# Patient Record
Sex: Male | Born: 1935 | Race: White | Hispanic: No
Health system: Southern US, Community
[De-identification: ages and names within clinical notes are randomized; demographics above are authoritative.]

---

## 2003-08-10 ENCOUNTER — Other Ambulatory Visit: Payer: Self-pay

## 2006-02-05 ENCOUNTER — Ambulatory Visit: Payer: Self-pay | Admitting: General Surgery

## 2006-05-06 ENCOUNTER — Emergency Department: Payer: Self-pay | Admitting: Emergency Medicine

## 2006-05-06 ENCOUNTER — Other Ambulatory Visit: Payer: Self-pay

## 2006-05-28 ENCOUNTER — Ambulatory Visit: Payer: Self-pay | Admitting: Family Medicine

## 2006-12-16 ENCOUNTER — Inpatient Hospital Stay: Payer: Self-pay | Admitting: Internal Medicine

## 2006-12-16 ENCOUNTER — Other Ambulatory Visit: Payer: Self-pay

## 2007-05-09 ENCOUNTER — Ambulatory Visit: Payer: Self-pay | Admitting: Family Medicine

## 2007-05-09 IMAGING — CR DG FEMUR 2V*L*
2 series · 5 of 5 positions shown · non-contrast
Comparison: none

REASON FOR EXAM: pain
COMMENTS:

PROCEDURE:     KDR - KDXR FEMUR LEFT  - [DATE]  [DATE]
RESULT:     No fracture, dislocation or other acute bony abnormality is
identified. No lytic or blastic lesions are seen. The hip joint space is
well maintained. Atherosclerotic calcification is noted in the femoral
artery.

[Series 2: view not recorded · 0.17mm/px · 4 of 4 slices shown (1 of 2)]
[im 1/4]
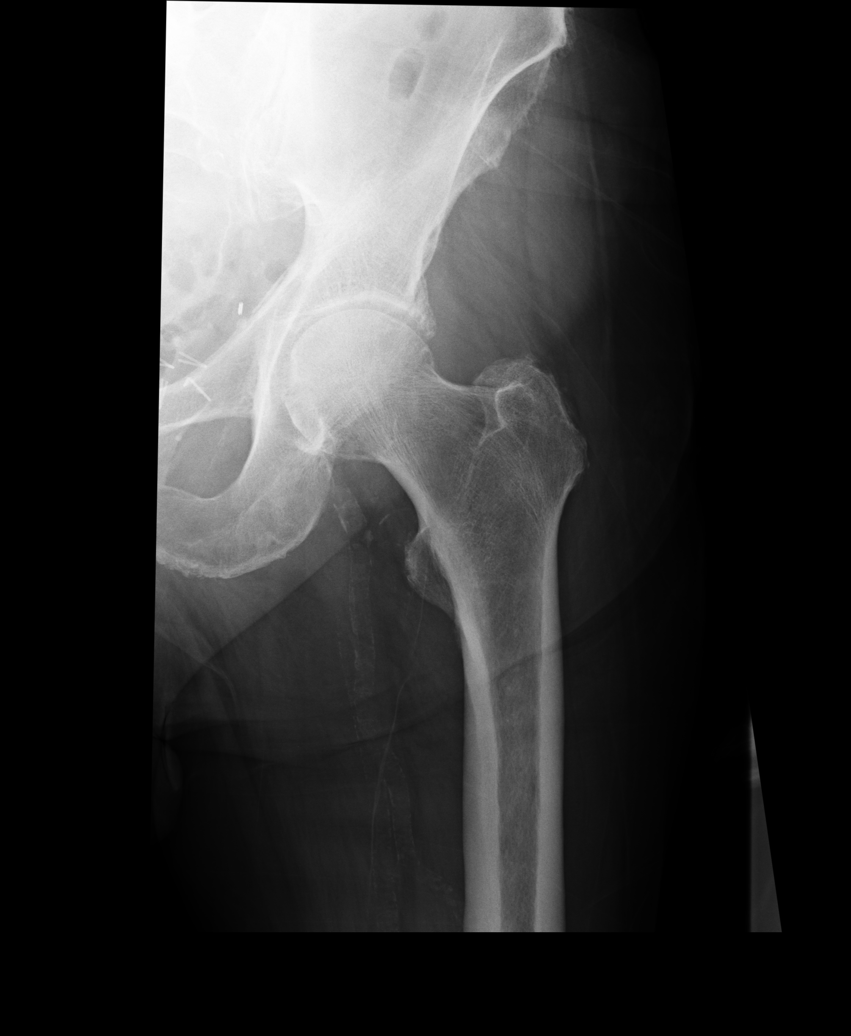
[im 2/4]
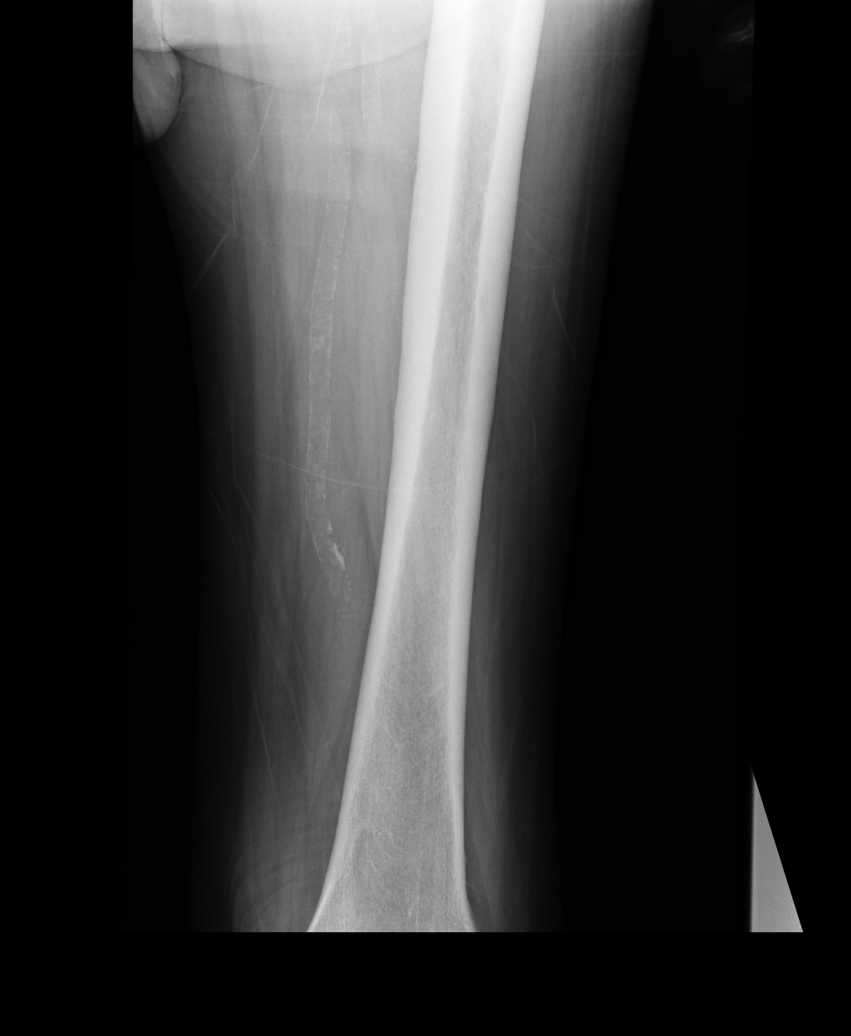
[im 3/4]
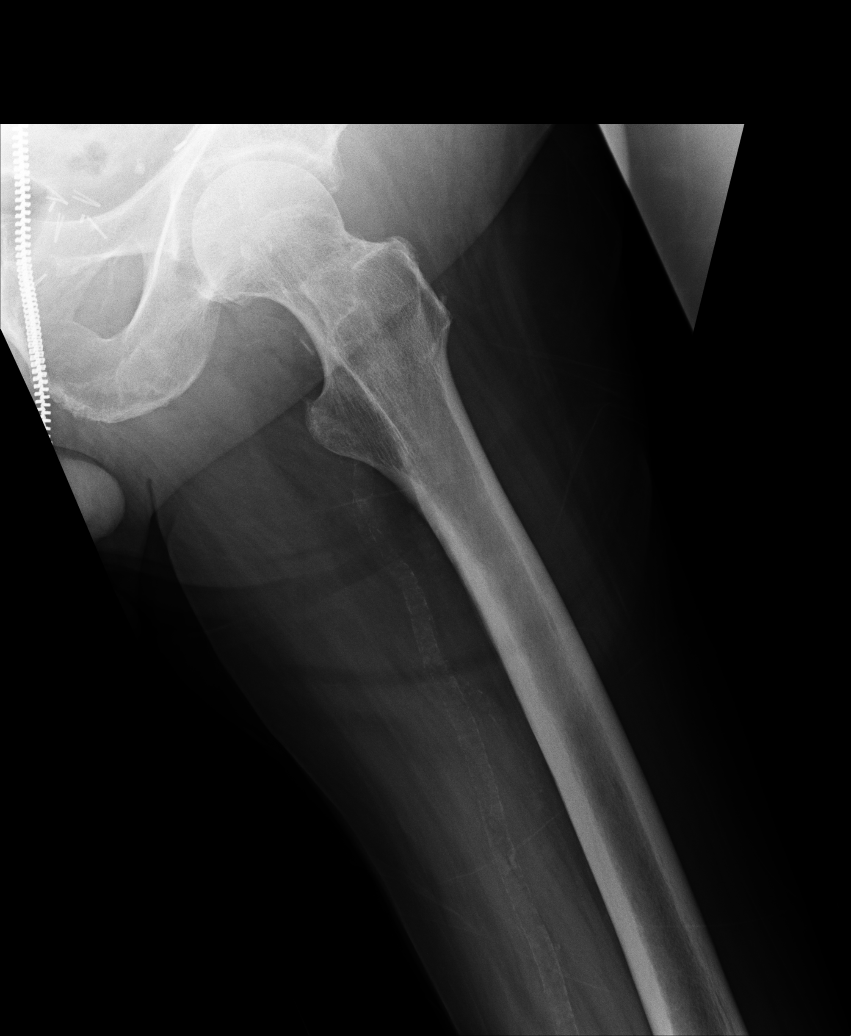
[im 4/4]
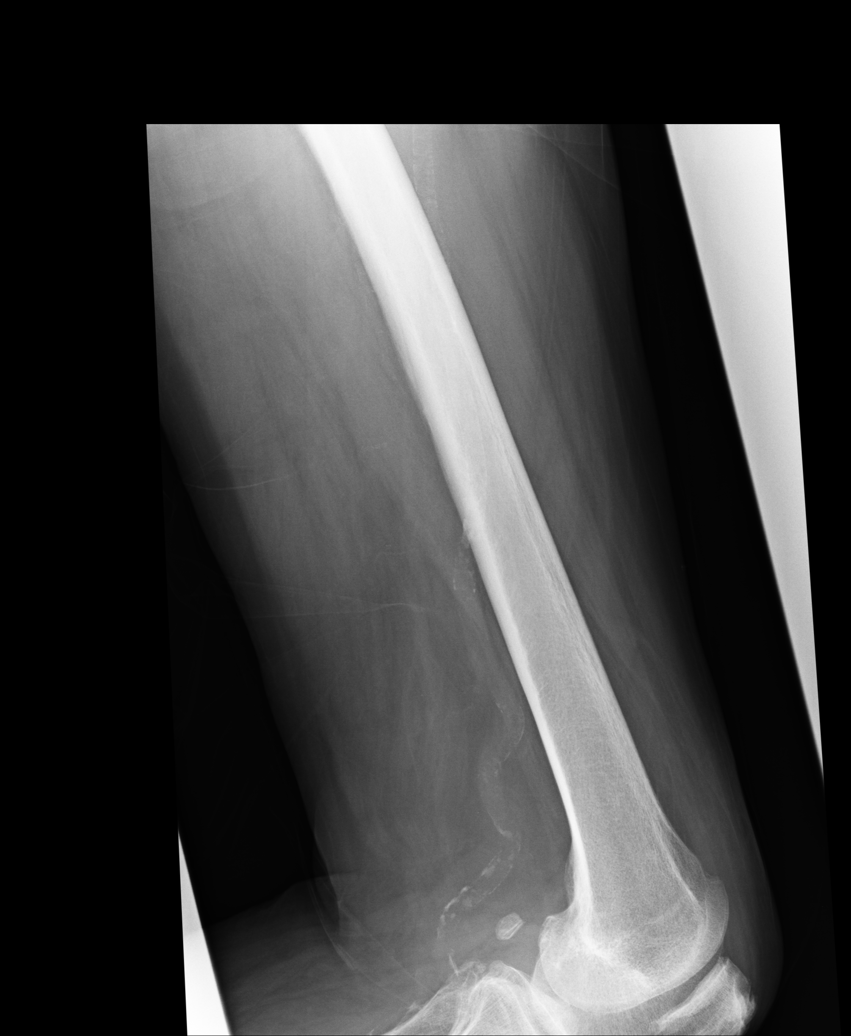

[view not recorded (2 of 2)]
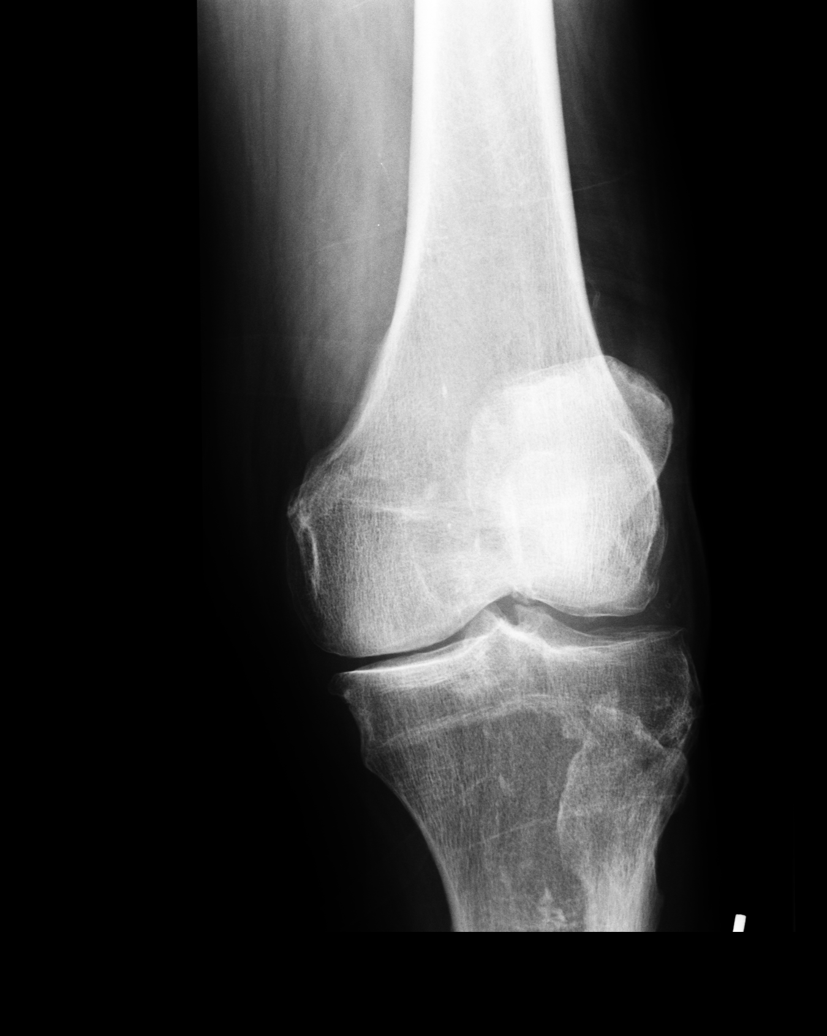

[5 of 5 positions shown; findings below may reference images not displayed]

IMPRESSION: 1.     No acute bony abnormality is identified.

## 2007-09-12 ENCOUNTER — Ambulatory Visit: Payer: Self-pay

## 2008-02-03 ENCOUNTER — Other Ambulatory Visit: Payer: Self-pay

## 2008-02-03 ENCOUNTER — Emergency Department: Payer: Self-pay | Admitting: Emergency Medicine

## 2009-07-17 ENCOUNTER — Inpatient Hospital Stay: Payer: Self-pay | Admitting: Internal Medicine

## 2009-08-30 ENCOUNTER — Ambulatory Visit: Payer: Self-pay | Admitting: Oncology

## 2009-09-20 ENCOUNTER — Inpatient Hospital Stay: Payer: Self-pay | Admitting: Internal Medicine

## 2009-09-27 ENCOUNTER — Ambulatory Visit: Payer: Self-pay | Admitting: Oncology

## 2009-10-07 ENCOUNTER — Ambulatory Visit: Payer: Self-pay | Admitting: Oncology

## 2009-10-21 ENCOUNTER — Ambulatory Visit: Payer: Self-pay | Admitting: Oncology

## 2009-10-28 ENCOUNTER — Ambulatory Visit: Payer: Self-pay | Admitting: Oncology

## 2010-01-27 ENCOUNTER — Ambulatory Visit: Payer: Self-pay | Admitting: Oncology

## 2010-02-10 ENCOUNTER — Ambulatory Visit: Payer: Self-pay | Admitting: Oncology

## 2010-02-24 ENCOUNTER — Ambulatory Visit: Payer: Self-pay | Admitting: Oncology

## 2010-02-27 ENCOUNTER — Ambulatory Visit: Payer: Self-pay | Admitting: Oncology

## 2010-03-07 ENCOUNTER — Ambulatory Visit: Payer: Self-pay | Admitting: Oncology

## 2010-03-09 ENCOUNTER — Ambulatory Visit: Payer: Self-pay | Admitting: Cardiothoracic Surgery

## 2010-03-14 LAB — PATHOLOGY REPORT

## 2010-03-30 ENCOUNTER — Ambulatory Visit: Payer: Self-pay | Admitting: Oncology

## 2010-04-29 ENCOUNTER — Ambulatory Visit: Payer: Self-pay | Admitting: Oncology

## 2010-05-30 ENCOUNTER — Ambulatory Visit: Payer: Self-pay | Admitting: Oncology

## 2010-06-29 ENCOUNTER — Ambulatory Visit: Payer: Self-pay | Admitting: Oncology

## 2010-07-30 ENCOUNTER — Ambulatory Visit: Payer: Self-pay | Admitting: Oncology

## 2010-08-30 ENCOUNTER — Ambulatory Visit: Payer: Self-pay | Admitting: Oncology

## 2010-11-09 ENCOUNTER — Ambulatory Visit: Payer: Self-pay | Admitting: Oncology

## 2010-11-28 ENCOUNTER — Ambulatory Visit: Payer: Self-pay | Admitting: Oncology

## 2011-02-28 ENCOUNTER — Ambulatory Visit: Payer: Self-pay | Admitting: Internal Medicine

## 2011-02-28 ENCOUNTER — Ambulatory Visit: Payer: Self-pay | Admitting: Oncology

## 2011-03-10 ENCOUNTER — Inpatient Hospital Stay: Payer: Self-pay | Admitting: Orthopedic Surgery

## 2011-03-14 LAB — PATHOLOGY REPORT

## 2011-03-31 ENCOUNTER — Ambulatory Visit: Payer: Self-pay | Admitting: Oncology

## 2011-03-31 ENCOUNTER — Ambulatory Visit: Payer: Self-pay | Admitting: Internal Medicine

## 2011-04-09 ENCOUNTER — Ambulatory Visit: Payer: Self-pay | Admitting: Oncology

## 2011-04-30 ENCOUNTER — Ambulatory Visit: Payer: Self-pay | Admitting: Oncology

## 2011-05-31 ENCOUNTER — Ambulatory Visit: Payer: Self-pay | Admitting: Oncology

## 2011-06-14 ENCOUNTER — Inpatient Hospital Stay: Payer: Self-pay | Admitting: Internal Medicine

## 2011-06-20 ENCOUNTER — Ambulatory Visit: Payer: Self-pay | Admitting: Radiation Oncology

## 2011-06-30 ENCOUNTER — Ambulatory Visit: Payer: Self-pay | Admitting: Oncology

## 2011-07-02 ENCOUNTER — Ambulatory Visit: Payer: Self-pay | Admitting: Radiation Oncology

## 2011-07-10 ENCOUNTER — Emergency Department: Payer: Self-pay | Admitting: Unknown Physician Specialty

## 2011-07-31 ENCOUNTER — Ambulatory Visit: Payer: Self-pay | Admitting: Oncology

## 2011-07-31 ENCOUNTER — Ambulatory Visit: Payer: Self-pay | Admitting: Radiation Oncology

## 2011-07-31 DEATH — deceased

## 2014-11-21 NOTE — Consult Note (Signed)
PATIENT NAME:  Patrick Obrien, Patrick Obrien MR#:  [id number] DATE OF BIRTH:  March 09, 1926  DATE OF CONSULTATION:  06/14/2011  REFERRING PHYSICIAN:   CONSULTING PHYSICIAN:  Knute Neuobert G. Lorre NickGittin, Obrien  HISTORY OF PRESENT ILLNESS: Patrick Obrien is an 81 year old patient [facility]. The patient's history is of lung cancer with progressive disease, adenocarcinoma, he declined further therapy and has been followed by Hosp[facility]. He prior also had radiation to the lung and also recent traumatic fracture. He has a history of alcohol use and prior DTs. His history also includes chronic obstructive pulmonary disease, GI bleed, prostate cancer, depression, peripheral neuropathy, hypertension, diabetes, and transient ischemic attack.   FAMILY HISTORY: Noncontributory.   SOCIAL HISTORY: Continues to smoke.   MEDICATIONS AT THE TIME OF ADMISSION:  1. Amitriptyline 10 mg at bedtime.  2. Amlodipine 5 mg daily.  3. Enalapril 20 mg at night. 4. Glipizide 5 mg daily.  5. Potassium 20 mEq daily. 6. Magnesium oxide 400 mg b.i.Obrien.  7. Metoprolol 25 b.i.Obrien.  8. Multivitamin daily.  9. Protonix 40 b.i.Obrien. 10. Spiriva daily.  11. Tylenol p.r.n.  12. Oxycodone 5 mg every four hours p.r.n.  13. Zoloft 12.5 mg daily.  14. Aspirin 81 mg daily.  15. DuoNebs p.r.n.  16. Thiamine daily.  17. Neurontin 100 mg at night.  18. Sliding scale insulin.   CURRENT SYSTEM REVIEW: The patient has no acute complaints. No spontaneous complaints. He is confused but he is alert, responds and answers some questions, has poor memory but knows about his cancer and remembers previous treatment and radiation. He remembered he has had some falls. Was not aware of the events of today. He denied on questioning any abdominal pain, any shortness of breath, any headache, any dizziness, any back pain.   PHYSICAL EXAMINATION:   GENERAL: He is alert and cooperative, answers questions, has some memory lapses, is easily  confused. He has pallor. No jaundice.   HEENT: Head is atraumatic.   MOUTH: No thrush.   LYMPH: No palpable lymph nodes in the neck, supraclavicular, submandibular.   LUNGS: Clear. No wheezing or rales.   ABDOMEN: Nontender. No palpable mass or organomegaly.   EXTREMITIES: No edema. Strength grossly intact.   LABORATORY, DIAGNOSTIC, AND RADIOLOGICAL DATA: On admission, hemoglobin 13, glucose 140, creatinine 1.17. Liver functions were normal. Troponin was low. He had a sinus tachycardia. CT of the brain 3.5 x 2.5 cm mass in the right frontoparietal region.   IMPRESSION AND PLAN: The patient is with confusion, altered mental status, has been falling recently, has evidence of a new brain metastasis. He has been given Decadron. He has hypertension and his antihypertensives have been restarted. He has COPD currently well compensated and his diabetes will need sliding scale insulin with Decadron added.        For the brain mass, as I have also discussed with his daughter tonight, although he otherwise has progressive disease and is not taking systemic treatment for symptom palliation to possibly improve balance and to possibly improve and maintain mental status to help prevent seizures, for palliation of symptoms, we will proceed with radiation evaluation and radiation palliative treatment after brain MRI. Also discussed with Patrick Obrien.    ____________________________ Knute Neuobert G. Lorre NickGittin, Obrien rgg:drc Obrien: 06/14/2011 17:58:04 ET T: 06/15/2011 07:28:14 ET JOB#: [id number]  cc: Knute Neuobert G. Lorre NickGittin, Obrien, <Dictator> Patrick Obrien ELECTRONICALLY SIGNED 08/06/2011 11:43
